# Patient Record
Sex: Male | Born: 1985 | Race: White | Hispanic: No | Marital: Single | Smoking: Never smoker
Health system: Southern US, Community
[De-identification: ages and names within clinical notes are randomized; demographics above are authoritative.]

## PROBLEM LIST (undated history)

## (undated) DIAGNOSIS — R569 Unspecified convulsions: Secondary | ICD-10-CM

## (undated) HISTORY — PX: ANKLE SURGERY: SHX546

## (undated) HISTORY — PX: TONSILLECTOMY: SUR1361

## (undated) HISTORY — PX: FRACTURE SURGERY: SHX138

---

## 2009-10-12 ENCOUNTER — Emergency Department (HOSPITAL_COMMUNITY): Admission: EM | Admit: 2009-10-12 | Discharge: 2009-10-12 | Payer: Self-pay | Admitting: Emergency Medicine

## 2011-04-03 LAB — BASIC METABOLIC PANEL
CO2: 27 mEq/L (ref 19–32)
Chloride: 101 mEq/L (ref 96–112)
GFR calc Af Amer: >60 mL/min (ref >60)
Glucose, Bld: 160 mg/dL — ABNORMAL HIGH (ref 70–99)
Potassium: 4 mEq/L (ref 3.5–5.1)
Sodium: 135 mEq/L (ref 135–145)

## 2011-04-03 LAB — RAPID URINE DRUG SCREEN, HOSP PERFORMED
Amphetamines: POSITIVE — AB
Opiates: NOT DETECTED
Tetrahydrocannabinol: POSITIVE — AB

## 2011-11-01 ENCOUNTER — Emergency Department (HOSPITAL_COMMUNITY): Payer: Self-pay

## 2011-11-01 ENCOUNTER — Emergency Department (HOSPITAL_COMMUNITY)
Admission: EM | Admit: 2011-11-01 | Discharge: 2011-11-01 | Disposition: A | Discharge status: Home / Self Care | Payer: Self-pay | Attending: Emergency Medicine | Admitting: Emergency Medicine

## 2011-11-01 DIAGNOSIS — R609 Edema, unspecified: Secondary | ICD-10-CM | POA: Insufficient documentation

## 2011-11-01 DIAGNOSIS — M25439 Effusion, unspecified wrist: Secondary | ICD-10-CM | POA: Insufficient documentation

## 2011-11-01 DIAGNOSIS — Y9363 Activity, rugby: Secondary | ICD-10-CM | POA: Insufficient documentation

## 2011-11-01 DIAGNOSIS — S59909A Unspecified injury of unspecified elbow, initial encounter: Secondary | ICD-10-CM | POA: Insufficient documentation

## 2011-11-01 DIAGNOSIS — F988 Other specified behavioral and emotional disorders with onset usually occurring in childhood and adolescence: Secondary | ICD-10-CM | POA: Insufficient documentation

## 2011-11-01 DIAGNOSIS — M79609 Pain in unspecified limb: Secondary | ICD-10-CM | POA: Insufficient documentation

## 2011-11-01 DIAGNOSIS — S6990XA Unspecified injury of unspecified wrist, hand and finger(s), initial encounter: Secondary | ICD-10-CM | POA: Insufficient documentation

## 2011-11-01 DIAGNOSIS — M25539 Pain in unspecified wrist: Secondary | ICD-10-CM | POA: Insufficient documentation

## 2011-11-01 DIAGNOSIS — W219XXA Striking against or struck by unspecified sports equipment, initial encounter: Secondary | ICD-10-CM | POA: Insufficient documentation

## 2016-02-20 ENCOUNTER — Encounter (HOSPITAL_COMMUNITY): Payer: Self-pay | Admitting: Cardiology

## 2016-02-20 ENCOUNTER — Emergency Department (HOSPITAL_COMMUNITY)
Admission: EM | Admit: 2016-02-20 | Discharge: 2016-02-20 | Disposition: A | Discharge status: Home / Self Care | Payer: Self-pay | Attending: Emergency Medicine | Admitting: Emergency Medicine

## 2016-02-20 DIAGNOSIS — L03011 Cellulitis of right finger: Secondary | ICD-10-CM | POA: Insufficient documentation

## 2016-02-20 MED ORDER — AMOXICILLIN-POT CLAVULANATE 875-125 MG PO TABS
1.0000 | ORAL_TABLET | Freq: Once | ORAL | Status: AC
  Administered 2016-02-20: 1 via ORAL
  Filled 2016-02-20: qty 1

## 2016-02-20 MED ORDER — HYDROCODONE-ACETAMINOPHEN 5-325 MG PO TABS
1.0000 | ORAL_TABLET | Freq: Once | ORAL | Status: AC
  Administered 2016-02-20: 1 via ORAL
  Filled 2016-02-20: qty 1

## 2016-02-20 MED ORDER — AMOXICILLIN-POT CLAVULANATE 875-125 MG PO TABS: 1.0000 | ORAL_TABLET | Freq: Two times a day (BID) | ORAL | Status: AC

## 2016-02-20 MED ORDER — OXYCODONE-ACETAMINOPHEN 5-325 MG PO TABS
ORAL_TABLET | ORAL | Status: DC
Start: 2016-02-20 — End: 2016-02-20
  Filled 2016-02-20: qty 1

## 2016-02-20 MED ORDER — OXYCODONE-ACETAMINOPHEN 5-325 MG PO TABS
1.0000 | ORAL_TABLET | Freq: Once | ORAL | Status: AC
  Administered 2016-02-20: 1 via ORAL

## 2016-02-20 MED ORDER — HYDROCODONE-ACETAMINOPHEN 5-325 MG PO TABS: 2.0000 | ORAL_TABLET | ORAL | Status: AC | PRN

## 2016-02-20 NOTE — ED Provider Notes (Signed)
CSN: [id number]     Arrival date & time 02/20/16  1155 History   First MD Initiated Contact with Patient 02/20/16 1619     Chief Complaint  Patient presents with  . Finger Injury   Patient is a 30 y.o. male presenting with hand pain. The history is provided by the patient. No language interpreter was used.  Hand Pain This is a new problem. The current episode started today. The problem occurs constantly. The problem has been gradually worsening. Pertinent negatives include no abdominal pain, anorexia, chest pain, chills, congestion, coughing, fever, headaches, joint swelling, nausea, neck pain, numbness, rash, sore throat, vomiting or weakness. Nothing aggravates the symptoms. He has tried nothing for the symptoms. The treatment provided no relief.    History reviewed. No pertinent past medical history. Past Surgical History  Procedure Laterality Date  . Ankle surgery     History reviewed. No pertinent family history. Social History  Substance Use Topics  . Smoking status: Never Smoker   . Smokeless tobacco: None  . Alcohol Use: No    Review of Systems  Constitutional: Negative for fever, chills, activity change and appetite change.  HENT: Negative for congestion, dental problem, ear pain, facial swelling, hearing loss, rhinorrhea, sneezing, sore throat, trouble swallowing and voice change.   Eyes: Negative for photophobia, pain, redness and visual disturbance.  Respiratory: Negative for apnea, cough, chest tightness, shortness of breath, wheezing and stridor.   Cardiovascular: Negative for chest pain, palpitations and leg swelling.  Gastrointestinal: Negative for nausea, vomiting, abdominal pain, diarrhea, constipation, blood in stool, abdominal distention and anorexia.  Endocrine: Negative for polydipsia and polyuria.  Genitourinary: Negative for frequency, hematuria, flank pain, decreased urine volume and difficulty urinating.  Musculoskeletal: Negative for back pain, joint  swelling, gait problem, neck pain and neck stiffness.  Skin: Negative for rash and wound.  Allergic/Immunologic: Negative for immunocompromised state.  Neurological: Negative for dizziness, syncope, facial asymmetry, speech difficulty, weakness, light-headedness, numbness and headaches.  Hematological: Negative for adenopathy.  Psychiatric/Behavioral: Negative for suicidal ideas, behavioral problems, confusion, sleep disturbance and agitation. The patient is not nervous/anxious.   All other systems reviewed and are negative.     Allergies  Review of patient's allergies indicates no known allergies.  Home Medications   Prior to Admission medications   Medication Sig Start Date End Date Taking? Authorizing Provider  amoxicillin-clavulanate (AUGMENTIN) 875-125 MG tablet Take 1 tablet by mouth 2 (two) times daily. 02/20/16 02/27/16  Dan Humphreys, MD  HYDROcodone-acetaminophen (NORCO/VICODIN) 5-325 MG tablet Take 2 tablets by mouth every 4 (four) hours as needed. 02/20/16   Dan Humphreys, MD   BP 161/92 mmHg  Pulse 55  Temp(Src) 98.3 F (36.8 C) (Oral)  Resp 18  SpO2 100% Physical Exam  Constitutional: He is oriented to person, place, and time. He appears well-developed and well-nourished. No distress.  HENT:  Head: Normocephalic and atraumatic.  Right Ear: External ear normal.  Left Ear: External ear normal.  Eyes: Pupils are equal, round, and reactive to light. Right eye exhibits no discharge. Left eye exhibits no discharge.  Neck: Normal range of motion. No JVD present. No tracheal deviation present.  Cardiovascular: Normal rate, regular rhythm and normal heart sounds.  Exam reveals no friction rub.   No murmur heard. Pulmonary/Chest: Effort normal and breath sounds normal. No stridor. No respiratory distress. He has no wheezes.  Abdominal: Soft. Bowel sounds are normal. He exhibits no distension. There is no rebound and no guarding.  Musculoskeletal: Normal range of  motion. He  exhibits no edema or tenderness.       Hands: Lymphadenopathy:    He has no cervical adenopathy.  Neurological: He is alert and oriented to person, place, and time. No cranial nerve deficit. Coordination normal.  Skin: Skin is warm and dry. No rash noted. No pallor.  Psychiatric: He has a normal mood and affect. His behavior is normal. Judgment and thought content normal.  Nursing note and vitals reviewed.   ED Course  Procedures (including critical care time) Labs Review Labs Reviewed - No data to display  Imaging Review No results found. I have personally reviewed and evaluated these images and lab results as part of my medical decision-making.   EKG Interpretation None      MDM   Final diagnoses:  Paronychia, right    Patient with no significant past medical history presents with paronychia to the right thumb. No abscess. No systemic symptoms. Patient does chew on his films include an cuticles.  Patient with cellulitis of base of fingernail but no systemic signs of infection. Patient has full range of motion of thumb.  Patient given [facility] emergency department for pain and given first dose of Augmentin emergency department.  He was discharged with prescription for Augmentin 7 days. He was given Norco for pain control for the next 2-3 days.  Patient are to follow with primary care physician for reevaluation return to ED for any new or worsening symptoms.  Discussed case by attending Dr. Fayrene Fearing.    Dan Humphreys, MD 02/20/16 1714  Rolland Porter, MD 02/29/16 [phone]

## 2016-02-20 NOTE — ED Notes (Signed)
Pt reports right thumb pain with redness and swelling around the thumb nail. States increased pain with palpation. States he picks at his nails often. Also reports pain into his hand.

## 2016-02-20 NOTE — ED Notes (Signed)
Pt instructed he will be unable to drive after pain medication per protocol

## 2016-02-20 NOTE — ED Provider Notes (Signed)
Pt seen and evaluated.  D/W Dr. Moody Bruins.  Exam shows erythema, but no abscess. Agree with abx, soaks, pain meds, and recheck if not improving, or if abscess develops.  Rolland Porter, MD 02/20/16 [phone]

## 2016-02-20 NOTE — Discharge Instructions (Signed)
Paronychia °Paronychia is an infection of the skin that surrounds a nail. It usually affects the skin around a fingernail, but it may also occur near a toenail. It often causes pain and swelling around the nail. This condition may come on suddenly or develop over a longer period. In some cases, a collection of pus (abscess) can form near or under the nail. Usually, paronychia is not serious and it clears up with treatment. °CAUSES °This condition may be caused by bacteria or fungi. It is commonly caused by either Streptococcus or Staphylococcus bacteria. The bacteria or fungi often cause the infection by getting into the affected area through an opening in the skin, such as a cut or a hangnail. °RISK FACTORS °This condition is more likely to develop in: °· People who get their hands wet often, such as those who work as dishwashers, bartenders, or nurses. °· People who bite their fingernails or suck their thumbs. °· People who trim their nails too short. °· People who have hangnails or injured fingertips. °· People who get manicures. °· People who have diabetes. °SYMPTOMS °Symptoms of this condition include: °· Redness and swelling of the skin near the nail. °· Tenderness around the nail when you touch the area. °· Pus-filled bumps under the cuticle. The cuticle is the skin at the base or sides of the nail. °· Fluid or pus under the nail. °· Throbbing pain in the area. °DIAGNOSIS °This condition is usually diagnosed with a physical exam. In some cases, a sample of pus may be taken from an abscess to be tested in a lab. This can help to determine what type of bacteria or fungi is causing the condition. °TREATMENT °Treatment for this condition depends on the cause and severity of the condition. If the condition is mild, it may clear up on its own in a few days. Your health care provider may recommend soaking the affected area in warm water a few times a day. When treatment is needed, the options may  include: °· Antibiotic medicine, if the condition is caused by a bacterial infection. °· Antifungal medicine, if the condition is caused by a fungal infection. °· Incision and drainage, if an abscess is present. In this procedure, the health care provider will cut open the abscess so the pus can drain out. °HOME CARE INSTRUCTIONS °· Soak the affected area in warm water if directed to do so by your health care provider. You may be told to do this for 20 minutes, 2-3 times a day. Keep the area dry in between soakings. °· Take medicines only as directed by your health care provider. °· If you were prescribed an antibiotic medicine, finish all of it even if you start to feel better. °· Keep the affected area clean. °· Do not try to drain a fluid-filled bump yourself. °· If you will be washing dishes or performing other tasks that require your hands to get wet, wear rubber gloves. You should also wear gloves if your hands might come in contact with irritating substances, such as cleaners or chemicals. °· Follow your health care provider's instructions about: °¨ Wound care. °¨ Bandage (dressing) changes and removal. °SEEK MEDICAL CARE IF: °· Your symptoms get worse or do not improve with treatment. °· You have a fever or chills. °· You have redness spreading from the affected area. °· You have continued or increased fluid, blood, or pus coming from the affected area. °· Your finger or knuckle becomes swollen or is difficult to move. °  °  This information is not intended to replace advice given to you by your health care provider. Make sure you discuss any questions you have with your health care provider. °  °Document Released: 06/10/2001 Document Revised: 05/01/2015 Document Reviewed: 11/22/2014 °Elsevier Interactive Patient Education ©2016 [employer] ° °

## 2016-02-20 NOTE — ED Notes (Signed)
Esignature pad not available at this time. Pt agreeable to discharge

## 2016-04-03 ENCOUNTER — Encounter (HOSPITAL_COMMUNITY): Payer: Self-pay | Admitting: *Deleted

## 2016-04-03 ENCOUNTER — Emergency Department (HOSPITAL_COMMUNITY): Payer: Self-pay

## 2016-04-03 ENCOUNTER — Emergency Department (HOSPITAL_COMMUNITY)
Admission: EM | Admit: 2016-04-03 | Discharge: 2016-04-03 | Disposition: A | Discharge status: Home / Self Care | Payer: Self-pay | Attending: Emergency Medicine | Admitting: Emergency Medicine

## 2016-04-03 DIAGNOSIS — Y998 Other external cause status: Secondary | ICD-10-CM | POA: Insufficient documentation

## 2016-04-03 DIAGNOSIS — F1410 Cocaine abuse, uncomplicated: Secondary | ICD-10-CM | POA: Insufficient documentation

## 2016-04-03 DIAGNOSIS — Y9289 Other specified places as the place of occurrence of the external cause: Secondary | ICD-10-CM | POA: Insufficient documentation

## 2016-04-03 DIAGNOSIS — Z72820 Sleep deprivation: Secondary | ICD-10-CM | POA: Insufficient documentation

## 2016-04-03 DIAGNOSIS — S0003XA Contusion of scalp, initial encounter: Secondary | ICD-10-CM | POA: Insufficient documentation

## 2016-04-03 DIAGNOSIS — W228XXA Striking against or struck by other objects, initial encounter: Secondary | ICD-10-CM | POA: Insufficient documentation

## 2016-04-03 DIAGNOSIS — R4182 Altered mental status, unspecified: Secondary | ICD-10-CM | POA: Insufficient documentation

## 2016-04-03 DIAGNOSIS — F1210 Cannabis abuse, uncomplicated: Secondary | ICD-10-CM | POA: Insufficient documentation

## 2016-04-03 DIAGNOSIS — R569 Unspecified convulsions: Secondary | ICD-10-CM | POA: Insufficient documentation

## 2016-04-03 DIAGNOSIS — Y9389 Activity, other specified: Secondary | ICD-10-CM | POA: Insufficient documentation

## 2016-04-03 DIAGNOSIS — F1310 Sedative, hypnotic or anxiolytic abuse, uncomplicated: Secondary | ICD-10-CM | POA: Insufficient documentation

## 2016-04-03 HISTORY — DX: Unspecified convulsions: R56.9

## 2016-04-03 LAB — COMPREHENSIVE METABOLIC PANEL
ALK PHOS: 80 U/L (ref 38–126)
ALT: 23 U/L (ref 17–63)
ANION GAP: 10 (ref 5–15)
AST: 26 U/L (ref 15–41)
Albumin: 4.3 g/dL (ref 3.5–5.0)
BUN: 15 mg/dL (ref 6–20)
CALCIUM: 9.5 mg/dL (ref 8.9–10.3)
CHLORIDE: 104 mmol/L (ref 101–111)
CO2: 24 mmol/L (ref 22–32)
Creatinine, Ser: 1.06 mg/dL (ref 0.61–1.24)
Glucose, Bld: 107 mg/dL — ABNORMAL HIGH (ref 65–99)
Potassium: 4.2 mmol/L (ref 3.5–5.1)
SODIUM: 138 mmol/L (ref 135–145)
Total Bilirubin: 0.8 mg/dL (ref 0.3–1.2)
Total Protein: 7.5 g/dL (ref 6.5–8.1)

## 2016-04-03 LAB — CBC
HCT: 42.7 % (ref 39.0–52.0)
Hemoglobin: 14 g/dL (ref 13.0–17.0)
MCH: 29.4 pg (ref 26.0–34.0)
MCHC: 32.8 g/dL (ref 30.0–36.0)
MCV: 89.7 fL (ref 78.0–100.0)
PLATELETS: 250 10*3/uL (ref 150–400)
RBC: 4.76 MIL/uL (ref 4.22–5.81)
RDW: 12.9 % (ref 11.5–15.5)
WBC: 16 10*3/uL — ABNORMAL HIGH (ref 4.0–10.5)

## 2016-04-03 LAB — RAPID URINE DRUG SCREEN, HOSP PERFORMED
Amphetamines: NOT DETECTED
Barbiturates: NOT DETECTED
Benzodiazepines: POSITIVE — AB
COCAINE: POSITIVE — AB
OPIATES: NOT DETECTED
TETRAHYDROCANNABINOL: POSITIVE — AB

## 2016-04-03 LAB — ETHANOL

## 2016-04-03 MED ORDER — SODIUM CHLORIDE 0.9 % IV BOLUS (SEPSIS)
1000.0000 mL | Freq: Once | INTRAVENOUS | Status: AC
  Administered 2016-04-03: 1000 mL via INTRAVENOUS

## 2016-04-03 NOTE — ED Notes (Signed)
From home via [facility], AMS, hx of seizure, CBG 134, VSS, EKG NS  20g Left hand

## 2016-04-03 NOTE — ED Provider Notes (Signed)
CSN: [id number]     Arrival date & time 04/03/16  1619 History   First MD Initiated Contact with Patient 04/03/16 1639     Chief Complaint  Patient presents with  . Altered Mental Status     (Consider location/radiation/quality/duration/timing/severity/associated sxs/prior Treatment) Patient is a 30 y.o. male presenting with altered mental status. The history is provided by the patient.  Altered Mental Status Presenting symptoms: no confusion   Associated symptoms: seizures   Associated symptoms: no abdominal pain, no fever, no rash, no vomiting and no weakness   Patient w hx 2 prior seizures, presents w generalized seizure todayl asting approx 1 minute.  Pt indicates last seizure was years ago, had been placed on sz med then by pcp, but never refilled, and has been off for years.  States in past few days, relatively heavy etoh use.  Denies daily etoh use/abuse.  Also states slept v little last night. Today was stressed about making appointment, when had seizure. States had eaten/drank very little today. No fever or chills. w seizure, hit head, dull headache to area. No neck or back pain. Denies chest pain. No palpitations. No cough or uri c/o. No abd pain. No nvd. No dysuria or gu c/o. No fever or chills. No oral injury. No incontinence. Was briefly postictal, cbg 134, arrives with baseline mental status.       Past Medical History  Diagnosis Date  . Seizures [facility])    Past Surgical History  Procedure Laterality Date  . Ankle surgery    . Tonsillectomy     No family history on file. Social History  Substance Use Topics  . Smoking status: Never Smoker   . Smokeless tobacco: None  . Alcohol Use: Yes    Review of Systems  Constitutional: Negative for fever and chills.  HENT: Negative for sore throat.   Eyes: Negative for pain and visual disturbance.  Respiratory: Negative for shortness of breath.   Cardiovascular: Negative for chest pain.  Gastrointestinal: Negative for  vomiting, abdominal pain and diarrhea.  Endocrine: Negative for polyuria.  Genitourinary: Negative for dysuria and flank pain.  Musculoskeletal: Negative for back pain and neck pain.  Skin: Negative for rash.  Neurological: Positive for seizures. Negative for weakness and numbness.  Hematological: Does not bruise/bleed easily.  Psychiatric/Behavioral: Negative for confusion.      Allergies  Review of patient's allergies indicates no known allergies.  Home Medications   Prior to Admission medications   Medication Sig Start Date End Date Taking? Authorizing Provider  HYDROcodone-acetaminophen (NORCO/VICODIN) 5-325 MG tablet Take 2 tablets by mouth every 4 (four) hours as needed. 02/20/16   Dan HumphreysMichael Irick, MD   BP 124/79 mmHg  Pulse 63  Temp(Src) 98.2 F (36.8 C) (Oral)  Resp 14  SpO2 96% Physical Exam  Constitutional: He is oriented to person, place, and time. He appears well-developed and well-nourished. No distress.  HENT:  Mouth/Throat: Oropharynx is clear and moist.  Contusion to left superior scalp. Skin intact.   Eyes: Conjunctivae are normal. Pupils are equal, round, and reactive to light.  Neck: Normal range of motion. Neck supple. No tracheal deviation present. No thyromegaly present.  No stiffness or rigidity. No bruit.   Cardiovascular: Normal rate, regular rhythm, normal heart sounds and intact distal pulses.  Exam reveals no gallop and no friction rub.   No murmur heard. Pulmonary/Chest: Effort normal and breath sounds normal. No accessory muscle usage. No respiratory distress. He exhibits no tenderness.  Abdominal: Soft. Bowel sounds  are normal. He exhibits no distension. There is no tenderness.  Genitourinary:  No cva tenderness  Musculoskeletal: Normal range of motion. He exhibits no edema.  CTLS spine, non tender, aligned, no step off. No pain or focal bony tenderness on extremity exam.   Neurological: He is alert and oriented to person, place, and time.   Motor intact bil, stre 5/5. Steady gait.   Skin: Skin is warm and dry. He is not diaphoretic.  Psychiatric: He has a normal mood and affect.  Nursing note and vitals reviewed.   ED Course  Procedures (including critical care time) Labs Review  Results for orders placed or performed during the hospital encounter of 04/03/16  CBC  Result Value Ref Range   WBC 16.0 (H) 4.0 - 10.5 K/uL   RBC 4.76 4.22 - 5.81 MIL/uL   Hemoglobin 14.0 13.0 - 17.0 g/dL   HCT 16.1 09.6 - 04.5 %   MCV 89.7 78.0 - 100.0 fL   MCH 29.4 26.0 - 34.0 pg   MCHC 32.8 30.0 - 36.0 g/dL   RDW 40.9 81.1 - 91.4 %   Platelets 250 150 - 400 K/uL  Comprehensive metabolic panel  Result Value Ref Range   Sodium 138 135 - 145 mmol/L   Potassium 4.2 3.5 - 5.1 mmol/L   Chloride 104 101 - 111 mmol/L   CO2 24 22 - 32 mmol/L   Glucose, Bld 107 (H) 65 - 99 mg/dL   BUN 15 6 - 20 mg/dL   Creatinine, Ser 7.82 0.61 - 1.24 mg/dL   Calcium 9.5 8.9 - 95.6 mg/dL   Total Protein 7.5 6.5 - 8.1 g/dL   Albumin 4.3 3.5 - 5.0 g/dL   AST 26 15 - 41 U/L   ALT 23 17 - 63 U/L   Alkaline Phosphatase 80 38 - 126 U/L   Total Bilirubin 0.8 0.3 - 1.2 mg/dL   GFR calc non Af Amer >60 >60 mL/min   GFR calc Af Amer >60 >60 mL/min   Anion gap 10 5 - 15  Ethanol  Result Value Ref Range   Alcohol, Ethyl (B) <5 <5 mg/dL  Urine rapid drug screen (hosp performed)  Result Value Ref Range   Opiates NONE DETECTED NONE DETECTED   Cocaine POSITIVE (A) NONE DETECTED   Benzodiazepines POSITIVE (A) NONE DETECTED   Amphetamines NONE DETECTED NONE DETECTED   Tetrahydrocannabinol POSITIVE (A) NONE DETECTED   Barbiturates NONE DETECTED NONE DETECTED   Ct Head Wo Contrast  04/03/2016  CLINICAL DATA:  Seizure, contusion, headache EXAM: CT HEAD WITHOUT CONTRAST TECHNIQUE: Contiguous axial images were obtained from the base of the skull through the vertex without intravenous contrast. COMPARISON:  10/12/09 FINDINGS: No mass lesion. No midline shift. No acute  hemorrhage or hematoma. No extra-axial fluid collections. No evidence of acute infarction. No skull fracture. IMPRESSION: Normal head CT Electronically Signed   By: Esperanza Heir M.D.   On: 04/03/2016 18:29       I have personally reviewed and evaluated these images and lab results as part of my medical decision-making.   EKG Interpretation   Date/Time:  Thursday April 03 2016 16:51:47 EDT Ventricular Rate:  65 PR Interval:  142 QRS Duration: 94 QT Interval:  389 QTC Calculation: 404 R Axis:   73 Text Interpretation:  Sinus rhythm ST elev, probable normal early repol  pattern No previous tracing Confirmed by Denton Lank  MD, Caryn Bee ([id number]) on  04/03/2016 5:04:47 PM      MDM  Iv ns. Continuous pulse ox and monitor.   Seizure precautions.  Reviewed nursing notes and prior charts for additional history.   Labs. Ct.  Recheck, tolerating po fluids.  No c/o. No recurrent seizure. No pain. No nv. Afeb.  Pt w no tremor or shakes, denies daily etoh use/abuse.  Pt currently appears stable for d/c. Seizure precautions/no driving or swimming. Refer to close neurology f/u, with possible eeg, decision on meds then.       Cathren Laine, MD 04/03/16 [phone]

## 2016-04-03 NOTE — Discharge Instructions (Signed)
It was our pleasure to provide your ER care today - we hope that you feel better.  Get adequate sleep. Drink plenty of fluids.  Avoid alcohol and/or drug use.   Follow up with neurologist in the coming week - see referral - call office tomorrow morning to arrange appointment.  No driving, swimming, or operating heavy machinery, until cleared to do so by your doctor/neurologist.   Return to ER if worse, new symptoms, seizures, fever, trouble breathing, new or severe pain, other concern.        Seizure, Adult A seizure is abnormal electrical activity in the brain. Seizures usually last from 30 seconds to 2 minutes. There are various types of seizures. Before a seizure, you may have a warning sensation (aura) that a seizure is about to occur. An aura may include the following symptoms:   Fear or anxiety.  Nausea.  Feeling like the room is spinning (vertigo).  Vision changes, such as seeing flashing lights or spots. Common symptoms during a seizure include:  A change in attention or behavior (altered mental status).  Convulsions with rhythmic jerking movements.  Drooling.  Rapid eye movements.  Grunting.  Loss of bladder and bowel control.  Bitter taste in the mouth.  Tongue biting. After a seizure, you may feel confused and sleepy. You may also have an injury resulting from convulsions during the seizure. HOME CARE INSTRUCTIONS   If you are given medicines, take them exactly as prescribed by your health care provider.  Keep all follow-up appointments as directed by your health care provider.  Do not swim or drive or engage in risky activity during which a seizure could cause further injury to you or others until your health care provider says it is OK.  Get adequate rest.  Teach friends and family what to do if you have a seizure. They should:  Lay you on the ground to prevent a fall.  Put a cushion under your head.  Loosen any tight clothing around your  neck.  Turn you on your side. If vomiting occurs, this helps keep your airway clear.  Stay with you until you recover.  Know whether or not you need emergency care. SEEK IMMEDIATE MEDICAL CARE IF:  The seizure lasts longer than 5 minutes.  The seizure is severe or you do not wake up immediately after the seizure.  You have an altered mental status after the seizure.  You are having more frequent or worsening seizures. Someone should drive you to the emergency department or call local emergency services (911 in [place] MAKE SURE YOU:  Understand these instructions.  Will watch your condition.  Will get help right away if you are not doing well or get worse.   This information is not intended to replace advice given to you by your health care provider. Make sure you discuss any questions you have with your health care provider.   Document Released: 12/12/2000 Document Revised: 01/05/2015 Document Reviewed: 07/27/2013 Elsevier Interactive Patient Education 2016 Elsevier Inc.    Stimulant Use Disorder-Cocaine Cocaine is one of a group of powerful drugs called stimulants. Cocaine has medical uses for stopping nosebleeds and for pain control before minor nose or dental surgery. However, cocaine is misused because of the effects that it produces. These effects include:   A feeling of extreme pleasure.  Alertness.  High energy. Common street names for cocaine include coke, crack, blow, snow, and nose candy. Cocaine is snorted, dissolved in water and injected, or smoked.  Stimulants  are addictive because they activate regions of the brain that produce both the pleasurable sensation of "reward" and psychological dependence. Together, these actions account for loss of control and the rapid development of drug dependence. This means you become ill without the drug (withdrawal) and need to keep using it to function.  Stimulant use disorder is use of stimulants that disrupts your daily  life. It disrupts relationships with family and friends and how you do your job. Cocaine increases your blood pressure and heart rate. It can cause a heart attack or stroke. Cocaine can also cause death from irregular heart rate or seizures. SYMPTOMS Symptoms of stimulant use disorder with cocaine include:  Use of cocaine in larger amounts or over a longer period of time than intended.  Unsuccessful attempts to cut down or control cocaine use.  A lot of time spent obtaining, using, or recovering from the effects of cocaine.  A strong desire or urge to use cocaine (craving).  Continued use of cocaine in spite of major problems at work, school, or home because of use.  Continued use of cocaine in spite of relationship problems because of use.  Giving up or cutting down on important life activities because of cocaine use.  Use of cocaine over and over in situations when it is physically hazardous, such as driving a car.  Continued use of cocaine in spite of a physical problem that is likely related to use. Physical problems can include:  Malnutrition.  Nosebleeds.  Chest pain.  High blood pressure.  A hole that develops between the part of your nose that separates your nostrils (perforated nasal septum).  Lung and kidney damage.  Continued use of cocaine in spite of a mental problem that is likely related to use. Mental problems can include:  Schizophrenia-like symptoms.  Depression.  Bipolar mood swings.  Anxiety.  Sleep problems.  Need to use more and more cocaine to get the same effect, or lessened effect over time with use of the same amount of cocaine (tolerance).  Having withdrawal symptoms when cocaine use is stopped, or using cocaine to reduce or avoid withdrawal symptoms. Withdrawal symptoms include:  Depressed or irritable mood.  Low energy or restlessness.  Bad dreams.  Poor or excessive sleep.  Increased appetite. DIAGNOSIS Stimulant use disorder  is diagnosed by your health care provider. You may be asked questions about your cocaine use and how it affects your life. A physical exam may be done. A drug screen may be ordered. You may be referred to a mental health professional. The diagnosis of stimulant use disorder requires at least two symptoms within 12 months. The type of stimulant use disorder depends on the number of signs and symptoms you have. The type may be:  Mild. Two or three signs and symptoms.  Moderate. Four or five signs and symptoms.  Severe. Six or more signs and symptoms. TREATMENT Treatment for stimulant use disorder is usually provided by mental health professionals with training in substance use disorders. The following options are available:  Counseling or talk therapy. Talk therapy addresses the reasons you use cocaine and ways to keep you from using again. Goals of talk therapy include:  Identifying and avoiding triggers for use.  Handling cravings.  Replacing use with healthy activities.  Support groups. Support groups provide emotional support, advice, and guidance.  Medicine. Certain medicines may decrease cocaine cravings or withdrawal symptoms. HOME CARE INSTRUCTIONS  Take medicines only as directed by your health care provider.  Identify the  people and activities that trigger your cocaine use and avoid them.  Keep all follow-up visits as directed by your health care provider. SEEK MEDICAL CARE IF:  Your symptoms get worse or you relapse.  You are not able to take medicines as directed. SEEK IMMEDIATE MEDICAL CARE IF:  You have serious thoughts about hurting yourself or others.  You have a seizure, chest pain, sudden weakness, or loss of speech or vision. FOR MORE INFORMATION  National Institute on Drug Abuse: http://www.price-smith.com/  Substance Abuse and Mental Health Services Administration: SkateOasis.com.pt   This information is not intended to replace advice given to you by your health care  provider. Make sure you discuss any questions you have with your health care provider.   Document Released: 12/12/2000 Document Revised: 01/05/2015 Document Reviewed: 12/28/2013 Elsevier Interactive Patient Education 2016 [employer].    [employer] Guide Outpatient Counseling/Substance Abuse Adult The United Ways 211 is a great source of information about community services available.  Access by dialing 2-1-1 from anywhere in [place], or by website -  PooledIncome.pl.   Other Local Resources (Updated 12/2015)  Crisis Hotlines   Services     Area Served  [employer]  Crisis Hotline, available 24 hours a day, 7 days a week: 415-794-5486 [facility], [place]   [facility]  Crisis Hotline, available 24 hours a day, 7 days a week: (602) 501-7872 [facility], [place]  [facility]  Suicide Prevention Hotline, available 24 hours a day, 7 days a week: (432)841-6871 [facility] [place], [place]  [employer], available 24 hours a day, 7 days a week: (250) 235-6290 [facility], [place]   [employer] Access to [employer], available 24 hours a day, 7 days a week: (515) 827-0886 All   [facility]  Crisis Hotline, available 24 hours a day, 7 days a week: (754) 631-1377 All   Other Local Resources (Updated 12/2015)  Outpatient Counseling/ Substance Abuse Programs  Services     Address and Phone Number  ADS (Alcohol and Drug Services)   Options include Individual counseling, group counseling, intensive outpatient program (several hours a day, several days a week)  Offers depression assessments  Provides methadone maintenance program [phone] [address] [place], [place] [address]   [facility]   Offers partial hospitalization/day treatment and DUI/DWI programs  [employer], private insurance [phone] [address] [place], [place] [address]    [facility]    Services include intensive outpatient program (several hours a day, several days a week), outpatient treatment, DUI/DWI services, family education  Also has some services specifically for [employer] transitional housing  [phone] [address] [place], [place] [address]     [facility]  [employer], private pay, and private insurance [phone] [address], [address] [place], [place] [address]  [employer] of Care  Services include individual counseling, substance abuse intensive outpatient program (several hours a day, several days a week), day treatment  Delene Loll, Medicaid, private insurance [phone] [address] [place], [place] [address]  [facility]   Offers substance abuse intensive outpatient program (several hours a day, several days a week), partial hospitalization program [phone] [address] [place], [place] [address]  [phone] [address] [place], [place] [address]  [phone] [address] [place], [place] [address]  [phone], [address] [place], [place] [address]  [facility]  Individual counseling only  [employer]s private insurance only [phone] [address], [address] [place], [place] [address]  [facility]  Methadone maintenance program [phone] [address] [place], [place] [address]  [facility]  Walk-In Clinic providing substance abuse and mental health counseling  Accepts Medicaid, Medicare, private insurance  Offers sliding scale for uninsured [phone] [address] [place], [place]   [facility] [employer]  Offers individual counseling, and intensive in-home services [phone] [address], [address] [place], [place] [address]  [facility]  Offers individual counseling, family counseling,  group therapy, domestic violence counseling, consumer credit counseling  Accepts Medicare, Medicaid, private insurance  Offers sliding scale for uninsured [phone] [address] [place], [place] [address]  [phone] [facility], [address] [place], [place] [address]  [facility]  Offers individual, family and group counseling  3 locations - [place], [place], and [place]  [phone]  [address] [place], [place] [address]  [address] [place], [place] [address]  [address] [place], [place] [address]  [facility]    Offers psychiatric assessment, 8-week Intensive Outpatient Program (several hours a day, several times a week, daytime or evenings), early recovery group, family Program, medication management  Private pay or private insurance only [phone], or  [phone] [address] [place], [place] [address]  [facility]  Offers individual, couples and family counseling  Accepts Medicaid, private insurance, and sliding scale for uninsured [phone] [address] [place], [place] [address]  Len Blalockavid Fuller, MD  Individual counseling  Private insurance [phone] [address] [place], [place] [address]  [facility]   Offers assessment, substance abuse treatment, and behavioral health treatment [phone] [address] [place], [place] [address]  [facility]  Individual counseling  Accepts private insurance [phone] [address] [place], [place] [address]  [facility]  Individual counseling  Delene Lollccepts Medicare, private insurance [phone] [address] [place], [place] [address]  [facility]    Offers intensive outpatient program (several hours a day, several times a week)  Private pay, private insurance [phone] [facility] [address] [facility] [place], [place]  [facility]  Individual  counseling  Medicare, private insurance [phone] [address], [address] [place], [place] [address]  [facility]    Offers intensive outpatient program (several hours a day, several times a week) and partial hospitalization program [phone] [address] [place], [place] [address]  Emerson MonteParrish McKinney, MD  Individual counseling [phone] [address], [address] [place], [place] [address]  [facility]  Offers Christian counseling to individuals, couples, and families  Accepts Medicare and private insurance; offers sliding scale for uninsured [phone] [address] [place], [place] [address]  [facility]  [facility] [phone] [address], [address] [place], [place] [address]  [facility]   Offers crisis counseling, individual counseling, group therapy, in-home therapy, domestic violence services, day treatment, DWI services, Administrator, artsCommunity Support Team (CST), Assertive Community Treatment Team (ACTT), substance abuse Intensive Outpatient Program (several hours a day, several times a week)  2 locations - [place] and [place] [phone] [address] [place], [place] [address]  [phone] [address] [place], [place] [address]  [facility]     Individual counseling and group therapy  Accepts private insurance, [place]are, [place]icaid [phone] [address] [place], [place]  [facility]  Offers individual and family counseling  Nutritional therapist private insurance and private pay [phone] [address] [place], [place] [address]  [facility]    Offers individual counseling, group therapy, and outpatient detox  Accepts private insurance [phone] [address] [place], [place]  [facility]  Individual counseling  Accepts Medicare, private insurance  [phone] [address] [place], [place] [address]  [employer]  Individual counseling  Accepts Medicare, private insurance [phone] [address] [place], [place] [address]  [facility]   Offers substance abuse Intensive Outpatient Program (several hours a day, several times a week) [phone], or [phone] [place], [place]

## 2016-04-03 NOTE — ED Notes (Signed)
Pt states states he smoked pot on Monday and drank pack of "high gravity beer" on Tues and Wed and he drank none today.  Denies hx of DT's.

## 2017-09-01 ENCOUNTER — Emergency Department (HOSPITAL_COMMUNITY)
Admission: EM | Admit: 2017-09-01 | Discharge: 2017-09-01 | Disposition: A | Discharge status: Home / Self Care | Payer: Self-pay | Attending: Emergency Medicine | Admitting: Emergency Medicine

## 2017-09-01 ENCOUNTER — Encounter (HOSPITAL_COMMUNITY): Payer: Self-pay | Admitting: *Deleted

## 2017-09-01 DIAGNOSIS — L237 Allergic contact dermatitis due to plants, except food: Secondary | ICD-10-CM | POA: Insufficient documentation

## 2017-09-01 MED ORDER — NAPROXEN 500 MG PO TABS: 500.0000 mg | ORAL_TABLET | Freq: Two times a day (BID) | ORAL | 0 refills | Status: AC

## 2017-09-01 MED ORDER — TRIAMCINOLONE ACETONIDE 0.1 % EX CREA: 1.0000 "application " | TOPICAL_CREAM | Freq: Two times a day (BID) | CUTANEOUS | 0 refills | Status: AC

## 2017-09-01 MED ORDER — PREDNISONE 20 MG PO TABS: ORAL_TABLET | ORAL | 0 refills | Status: AC

## 2017-09-01 MED ORDER — PREDNISONE 20 MG PO TABS
60.0000 mg | ORAL_TABLET | Freq: Once | ORAL | Status: AC
  Administered 2017-09-01: 60 mg via ORAL
  Filled 2017-09-01: qty 3

## 2017-09-01 MED ORDER — NAPROXEN 250 MG PO TABS
500.0000 mg | ORAL_TABLET | Freq: Once | ORAL | Status: AC
Start: 2017-09-01 — End: 2017-09-01
  Administered 2017-09-01: 500 mg via ORAL
  Filled 2017-09-01: qty 2

## 2017-09-01 NOTE — ED Notes (Signed)
Patient Alert and oriented X4. Stable and ambulatory. Patient verbalized understanding of the discharge instructions.  Patient belongings were taken by the patient.  

## 2017-09-01 NOTE — ED Triage Notes (Addendum)
Pt c/o poison ivy x 3 days to bilateral arms and hands; blister noted to L index finger

## 2017-09-01 NOTE — Discharge Instructions (Signed)
Take the prescribed medication as directed.  Can use calamine lotion to help dry out affected areas as well.  Try not to pop blisters, let them pop on their own.  Monitor for any signs of infection-- increased redness, drainage, fever, etc. Recommend Benadryl at nighttime since this makes you drowsy. Follow-up with your primary care doctor. Return to the ED for new or worsening symptoms.

## 2017-09-01 NOTE — ED Provider Notes (Signed)
[facility] DEPT Provider Note   CSN: [id number] Arrival date & time: 09/01/17  0118     History   Chief Complaint Chief Complaint  Patient presents with  . Poison Ivy    HPI Tanner Hobbs is a 31 y.o. male.  The history is provided by the patient and medical records.     31 year old male with history of seizures, presenting to the ED with rash. 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 States since then he has had rash popping up in several different areas, mostly bilateral hands, forearms, abdomen, and neck. States he has lost all of his clothes and shoes that he was wearing that it has taken multiple showers. States he tried cleansing the area with some over-the-counter cream and Windex to see her that would write out the poison ivy. He has not tried any Benadryl or steroid cream.  He denies any chest pain or shortness of breath. No difficulty swallowing or sensation of throat closing.  Past Medical History:  Diagnosis Date  . Seizures (HCC)     There are no active problems to display for this patient.   Past Surgical History:  Procedure Laterality Date  . ANKLE SURGERY    . FRACTURE SURGERY    . TONSILLECTOMY         Home Medications    Prior to Admission medications   Medication Sig Start Date End Date Taking? Authorizing Provider  HYDROcodone-acetaminophen (NORCO/VICODIN) 5-325 MG tablet Take 2 tablets by mouth every 4 (four) hours as needed. 02/20/16   Dan Humphreys, MD    Family History No family history on file.  Social History Social History  Substance Use Topics  . Smoking status: Never Smoker  . Smokeless tobacco: Never Used  . Alcohol use Yes     Allergies   Patient has no known allergies.   Review of Systems Review of Systems   Physical Exam Updated Vital Signs BP (!) 156/94 (BP Location: Right Arm)   Pulse 86   Temp 98.1 F (36.7 C) (Oral)   Resp 20   Ht 5\' 7"  (1.702 m)   Wt 72.6  kg (160 lb)   SpO2 100%   BMI 25.06 kg/m   Physical Exam  Constitutional: He is oriented to person, place, and time. He appears well-developed and well-nourished.  HENT:  Head: Normocephalic and atraumatic.  Mouth/Throat: Oropharynx is clear and moist.  No oral swelling or airway compromise  Eyes: Pupils are equal, round, and reactive to light. Conjunctivae and EOM are normal.  Neck: Normal range of motion.  Cardiovascular: Normal rate, regular rhythm and normal heart sounds.   Pulmonary/Chest: Effort normal and breath sounds normal.  Abdominal: Soft. Bowel sounds are normal.  Musculoskeletal: Normal range of motion.  Neurological: He is alert and oriented to person, place, and time.  Skin: Skin is warm and dry.  Vesicular rash and linear changes noted to dorsal hands, forearms, abdomen, and posterior neck, there is a larger blister noted to medial aspect of right index finger, this is intact without any active drainage, no signs of superimposed infection or cellulitis, no lesions on the palms  Psychiatric: He has a normal mood and affect.  Nursing note and vitals reviewed.    ED Treatments / Results  Labs (all labs ordered are listed, but only abnormal results are displayed) Labs Reviewed - No data to display  EKG  EKG Interpretation None  Radiology No results found.  Procedures Procedures (including critical care time)  Medications Ordered in ED Medications - No data to display   Initial Impression / Assessment and Plan / ED Course  I have reviewed the triage vital signs and the nursing notes.  Pertinent labs & imaging results that were available during my care of the patient were reviewed by me and considered in my medical decision making (see chart for details).  31 year old male here with rash. This is consistent with poison ivy. Works for [employer] and reports recent exposure. There are no signs of superimposed infection or cellulitis. No  airway compromise. VSS.  Does have rather diffuse rash across bilateral dorsal hands, forearms, abdomen, and posterior neck.  Will treat with oral and topical steroids, benadryl.  Can follow-up with PCP.  Discussed plan with patient, he acknowledged understanding and agreed with plan of care.  Return precautions given for new or worsening symptoms.  Final Clinical Impressions(s) / ED Diagnoses   Final diagnoses:  Poison ivy    New Prescriptions New Prescriptions   NAPROXEN (NAPROSYN) 500 MG TABLET    Take 1 tablet (500 mg total) by mouth 2 (two) times daily with a meal.   PREDNISONE (DELTASONE) 20 MG TABLET    Take 40 mg by mouth daily for 3 days, then 20mg  by mouth daily for 3 days, then 10mg  daily for 3 days   TRIAMCINOLONE CREAM (KENALOG) 0.1 %    Apply 1 application topically 2 (two) times daily.     Garlon HatchetSanders, Zamira Hickam M, PA-C 09/01/17 0236    Shon BatonHorton, Courtney F, MD 09/01/17 [phone]

## 2018-03-09 ENCOUNTER — Emergency Department (HOSPITAL_COMMUNITY): Payer: Self-pay

## 2018-03-09 ENCOUNTER — Emergency Department (HOSPITAL_COMMUNITY)
Admission: EM | Admit: 2018-03-09 | Discharge: 2018-03-09 | Disposition: A | Discharge status: Home / Self Care | Payer: Self-pay | Attending: Physician Assistant | Admitting: Physician Assistant

## 2018-03-09 ENCOUNTER — Encounter (HOSPITAL_COMMUNITY): Payer: Self-pay

## 2018-03-09 DIAGNOSIS — Z79899 Other long term (current) drug therapy: Secondary | ICD-10-CM | POA: Insufficient documentation

## 2018-03-09 DIAGNOSIS — R569 Unspecified convulsions: Secondary | ICD-10-CM | POA: Insufficient documentation

## 2018-03-09 LAB — COMPREHENSIVE METABOLIC PANEL
ALT: 26 U/L (ref 17–63)
ANION GAP: 12 (ref 5–15)
AST: 40 U/L (ref 15–41)
Albumin: 4.5 g/dL (ref 3.5–5.0)
Alkaline Phosphatase: 86 U/L (ref 38–126)
BILIRUBIN TOTAL: 1 mg/dL (ref 0.3–1.2)
BUN: 13 mg/dL (ref 6–20)
CHLORIDE: 105 mmol/L (ref 101–111)
CO2: 21 mmol/L — ABNORMAL LOW (ref 22–32)
Calcium: 9.3 mg/dL (ref 8.9–10.3)
Creatinine, Ser: 1.13 mg/dL (ref 0.61–1.24)
GFR calc Af Amer: >60 mL/min (ref >60)
GFR calc non Af Amer: >60 mL/min (ref >60)
Glucose, Bld: 88 mg/dL (ref 65–99)
POTASSIUM: 4 mmol/L (ref 3.5–5.1)
SODIUM: 138 mmol/L (ref 135–145)
TOTAL PROTEIN: 7.8 g/dL (ref 6.5–8.1)

## 2018-03-09 LAB — CBC WITH DIFFERENTIAL/PLATELET
BASOS ABS: 0 10*3/uL (ref 0.0–0.1)
Basophils Relative: 1 %
Eosinophils Absolute: 0.1 10*3/uL (ref 0.0–0.7)
Eosinophils Relative: 1 %
HEMATOCRIT: 43.9 % (ref 39.0–52.0)
Hemoglobin: 14.6 g/dL (ref 13.0–17.0)
LYMPHS ABS: 1.8 10*3/uL (ref 0.7–4.0)
LYMPHS PCT: 20 %
MCH: 30.6 pg (ref 26.0–34.0)
MCHC: 33.3 g/dL (ref 30.0–36.0)
MCV: 92 fL (ref 78.0–100.0)
MONO ABS: 0.8 10*3/uL (ref 0.1–1.0)
Monocytes Relative: 9 %
NEUTROS ABS: 6.2 10*3/uL (ref 1.7–7.7)
Neutrophils Relative %: 69 %
Platelets: 240 10*3/uL (ref 150–400)
RBC: 4.77 MIL/uL (ref 4.22–5.81)
RDW: 13.3 % (ref 11.5–15.5)
WBC: 8.8 10*3/uL (ref 4.0–10.5)

## 2018-03-09 LAB — I-STAT TROPONIN, ED: Troponin i, poc: 0 ng/mL (ref 0.00–0.08)

## 2018-03-09 LAB — I-STAT CG4 LACTIC ACID, ED: Lactic Acid, Venous: 3.02 mmol/L (ref 0.5–1.9)

## 2018-03-09 NOTE — ED Notes (Signed)
I-STAT LACTIC ACID RESULTS REPORTED TO Redmond BasemanHayden, RN and Corlis LeakMackuen, MD

## 2018-03-09 NOTE — ED Provider Notes (Signed)
[facility]   CSN: [id number] Arrival date & time: 03/09/18  1140     History   Chief Complaint Chief Complaint  Patient presents with  . Fall    HPI Tanner Hobbs is a 32 y.o. male.  HPI   Patient is a 32 year old male who had unwitnessed fall from roof while installing solar panels.  Coworkers were unsure whether he got electrocuted because there is been some open electrical areas on the roof.  Patient also has history of seizures.  Patient was confused on EMS arrival, postictal.  But is now alert and oriented x3 but with no memory of falling.  Past Medical History:  Diagnosis Date  . Seizures (HCC)     There are no active problems to display for this patient.   Past Surgical History:  Procedure Laterality Date  . ANKLE SURGERY    . FRACTURE SURGERY    . TONSILLECTOMY         Home Medications    Prior to Admission medications   Medication Sig Start Date End Date Taking? Authorizing Provider  HYDROcodone-acetaminophen (NORCO/VICODIN) 5-325 MG tablet Take 2 tablets by mouth every 4 (four) hours as needed. 02/20/16   Dan HumphreysIrick, Michael, MD  naproxen (NAPROSYN) 500 MG tablet Take 1 tablet (500 mg total) by mouth 2 (two) times daily with a meal. 09/01/17   Garlon HatchetSanders, Lisa M, PA-C  predniSONE (DELTASONE) 20 MG tablet Take 40 mg by mouth daily for 3 days, then 20mg  by mouth daily for 3 days, then 10mg  daily for 3 days 09/01/17   Garlon HatchetSanders, Lisa M, PA-C  triamcinolone cream (KENALOG) 0.1 % Apply 1 application topically 2 (two) times daily. 09/01/17   Garlon HatchetSanders, Lisa M, PA-C    Family History No family history on file.  Social History Social History   Tobacco Use  . Smoking status: Never Smoker  . Smokeless tobacco: Never Used  Substance Use Topics  . Alcohol use: Yes  . Drug use: Yes    Types: Marijuana     Allergies   Patient has no known allergies.   Review of Systems Review of Systems  Constitutional: Negative  for activity change.  Respiratory: Negative for shortness of breath.   Cardiovascular: Negative for chest pain.  Gastrointestinal: Negative for abdominal pain.  Neurological: Positive for headaches.  All other systems reviewed and are negative.    Physical Exam Updated Vital Signs Temp 98.5 F (36.9 C) (Oral)   SpO2 94%   Physical Exam  Constitutional: He is oriented to person, place, and time. He appears well-nourished.  HENT:  Head: Normocephalic.  Eyes: Conjunctivae are normal. Right eye exhibits no discharge. Left eye exhibits no discharge.  Cardiovascular: Normal rate and regular rhythm.  No murmur heard. Pulmonary/Chest: Effort normal and breath sounds normal. No respiratory distress.  Neurological: He is oriented to person, place, and time.  Alert and oriented x3 however amnestic to the event.  Skin: Skin is warm and dry. He is not diaphoretic.  Small area of erythema to the right head.  Otherwise no trauma.  Psychiatric: He has a normal mood and affect. His behavior is normal.     ED Treatments / Results  Labs (all labs ordered are listed, but only abnormal results are displayed) Labs Reviewed  COMPREHENSIVE METABOLIC PANEL  CBC WITH DIFFERENTIAL/PLATELET  I-STAT TROPONIN, ED    EKG  EKG Interpretation None       Radiology No results found.  Procedures Procedures (including  critical care time)  Medications Ordered in ED Medications - No data to display   Initial Impression / Assessment and Plan / ED Course  I have reviewed the triage vital signs and the nursing notes.  Pertinent labs & imaging results that were available during my care of the patient were reviewed by me and considered in my medical decision making (see chart for details).     Patient is a 32 year old male who had unwitnessed fall from roof while installing solar panels.  Coworkers were unsure whether he got electrocuted because there is been some open electrical areas on the  roof.  Patient also has history of seizures.  Patient was confused on EMS arrival, postictal.  But is now alert and oriented x3 but with no memory of falling.  12:04 PM Given not remembering following off, and patient's history of seizure, as well as confusion after the fall,, I am concerned that this could have been a seizure.  Also possible that is a concussion.  We will get baseline labs, get lactic, try to detect whether it was a seizure.  Will get EKG and troponin given the questionable history of electrocution. If all is reassuring, patient's physical exam is normal, and vital signs continue to remain many normal, patient will be discharged with instructions not to drive until follow-up with neurology.   Final Clinical Impressions(s) / ED Diagnoses   Final diagnoses:  None    ED Discharge Orders    None       Rosaleigh Brazzel, Cindee Salt, MD 03/09/18 1557

## 2018-03-09 NOTE — Discharge Instructions (Signed)
Please not drive until he follow-up with neurology.  Please do not perform any actions where it could be dangerous if you lose consciousness such as bathing alone, bathing a small child driving or operating heavy machinery or working on a roof.

## 2018-03-09 NOTE — ED Notes (Signed)
Patient ambulated without difficulty to restroom.

## 2018-03-09 NOTE — ED Triage Notes (Signed)
Pt presents via [facility] after fall today. Pt was installing solar panels on rooftop when a coworker heard him fall. Initially EMS was called for possible electrocution. Pt had LOC, [facility] 14.

## 2019-02-07 IMAGING — CT CT CERVICAL SPINE W/O CM
3 of 4 series · 12 of 33 positions shown, 14 images · non-contrast
Comparison: 04/03/2016

CLINICAL DATA: Fell from roof.  Possible electrical shock injury

EXAM:
CT HEAD WITHOUT CONTRAST
CT CERVICAL SPINE WITHOUT CONTRAST
TECHNIQUE: Multidetector CT imaging of the head and cervical spine was
performed following the standard protocol without intravenous
contrast. Multiplanar CT image reconstructions of the cervical spine
were also generated.

[Series 6: sag bone · sagittal · 0.24mm/px · 5 of 65 slices shown, 6 images]
[im 22/65  bone]
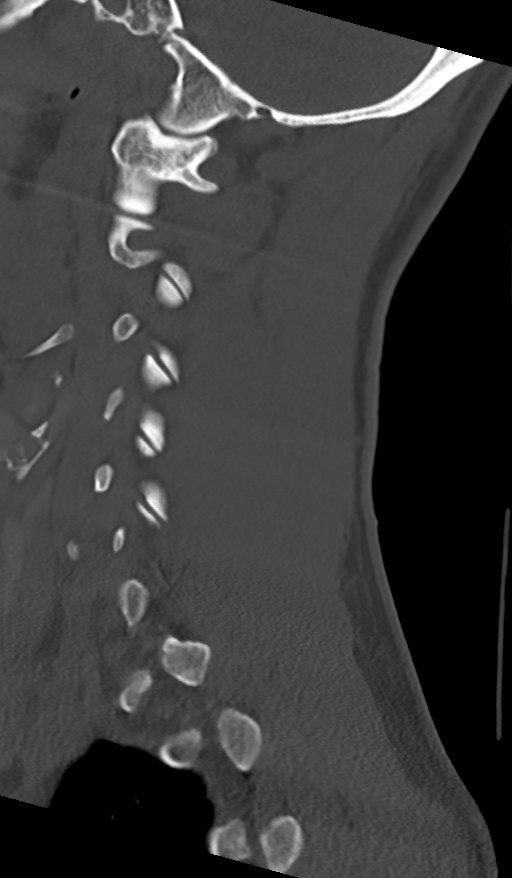
[im 27/65  bone]
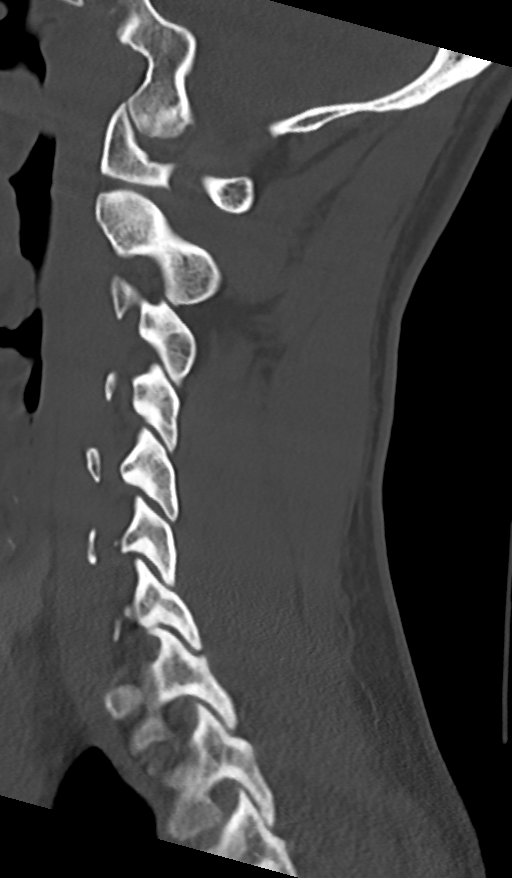
[im 33/65  soft-tissue]
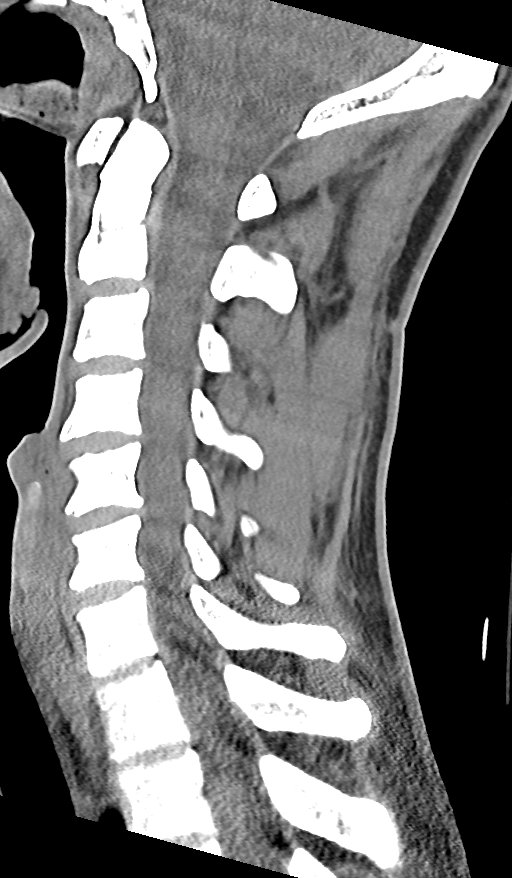
[im 33/65  bone]
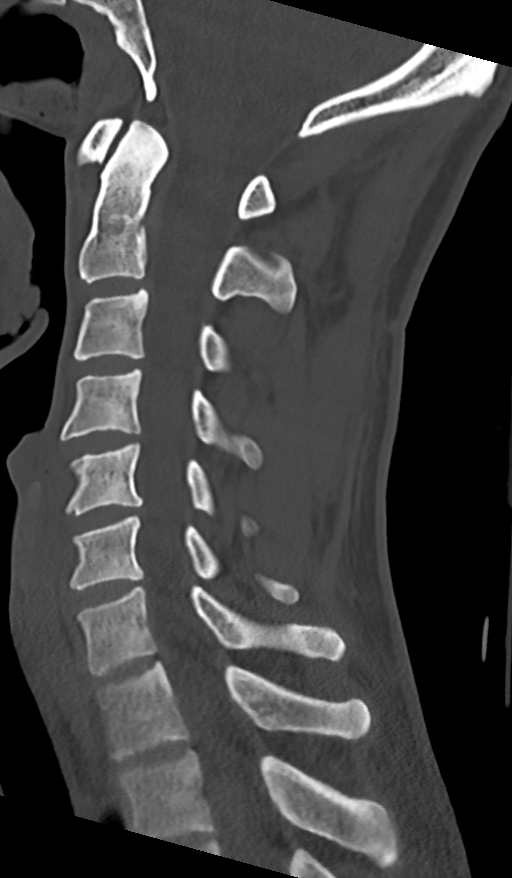
[im 38/65  bone]
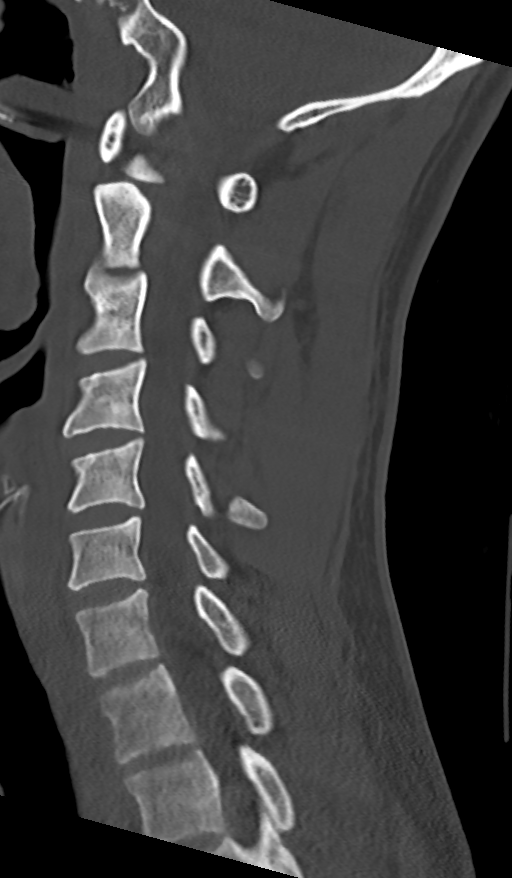
[im 43/65  bone]
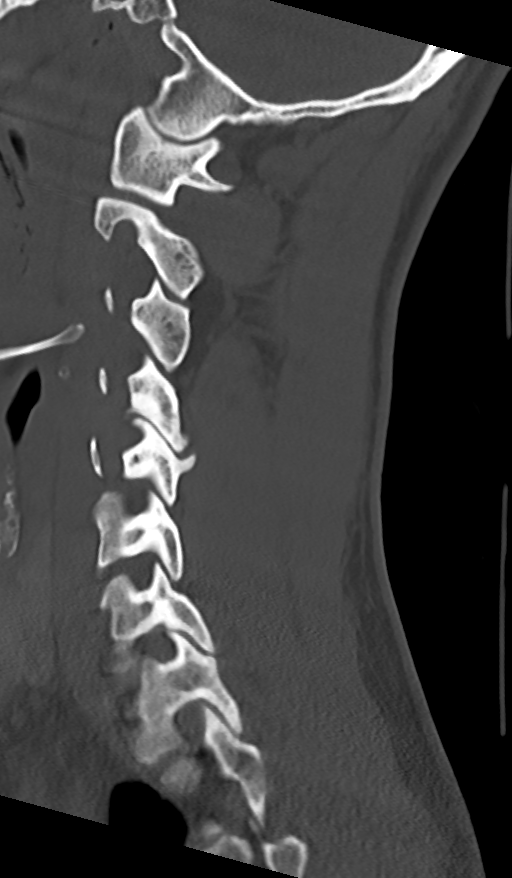

[Series 7: cor bone · coronal · 0.29mm/px · 3 of 61 slices shown]
[im 13/61  bone]
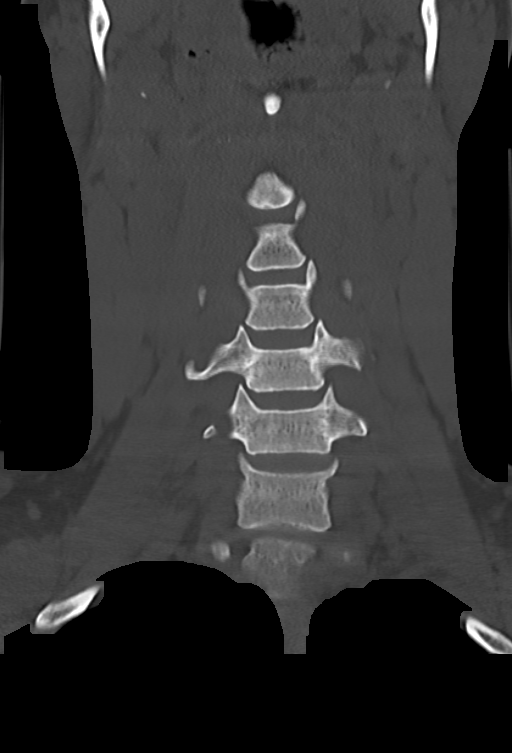
[im 25/61  bone]
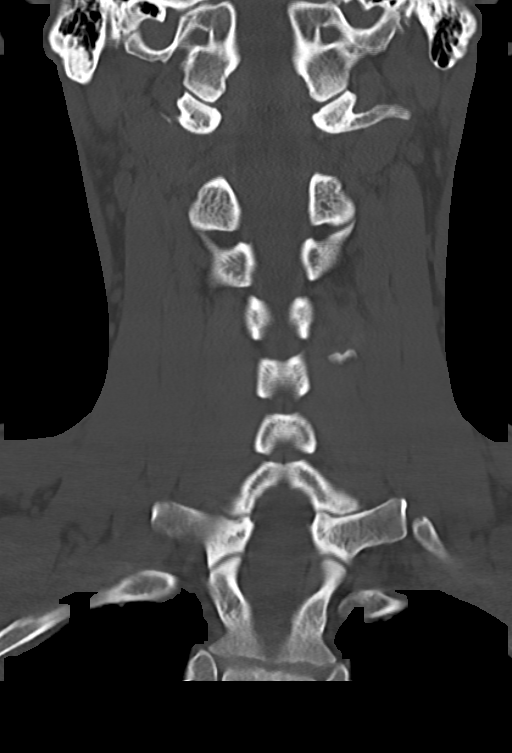
[im 37/61  bone]
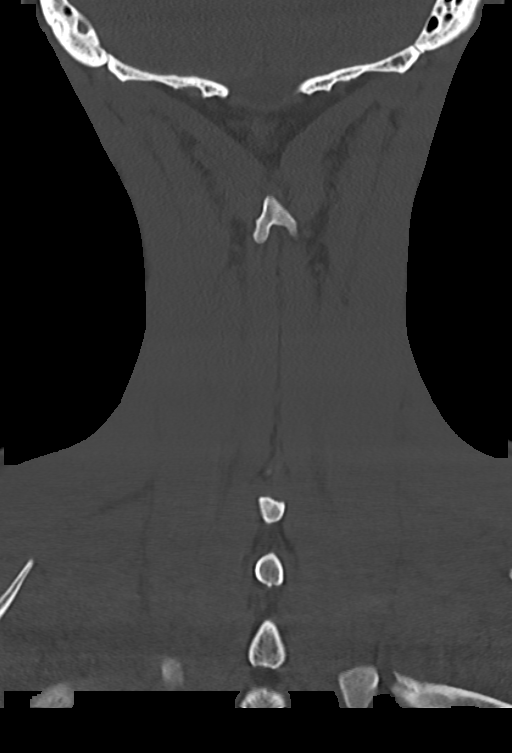

[Series 8: orthogonal axials · axial · 0.24mm/px · z∈[-241,-138]mm · 4 of 99 slices shown, 5 images]
[im 17/99  soft-tissue]
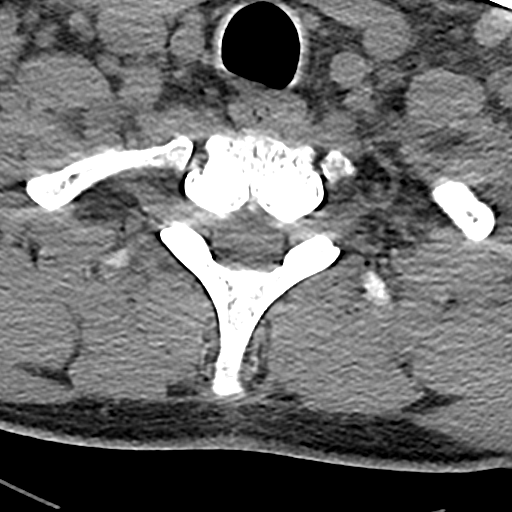
[im 17/99  bone]
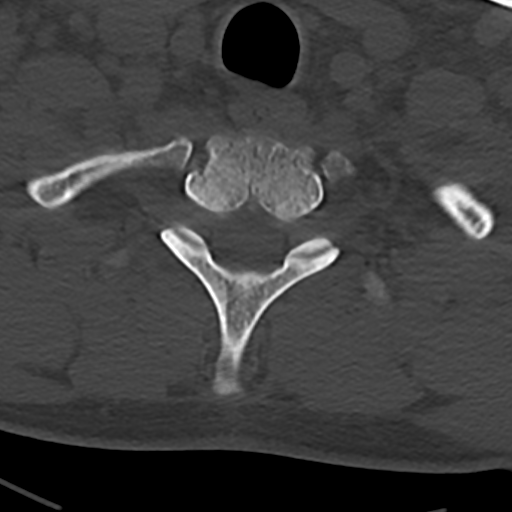
[im 33/99  bone]
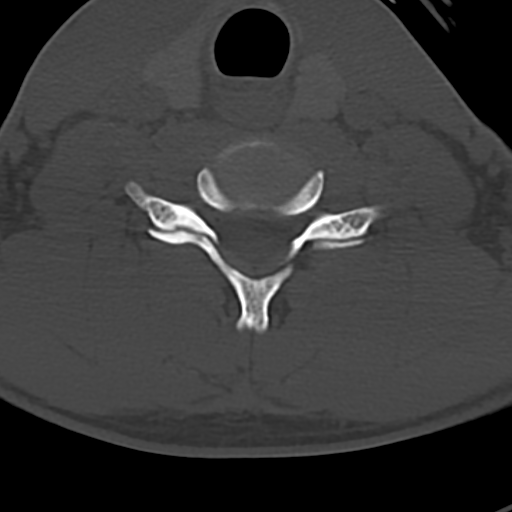
[im 66/99  bone]
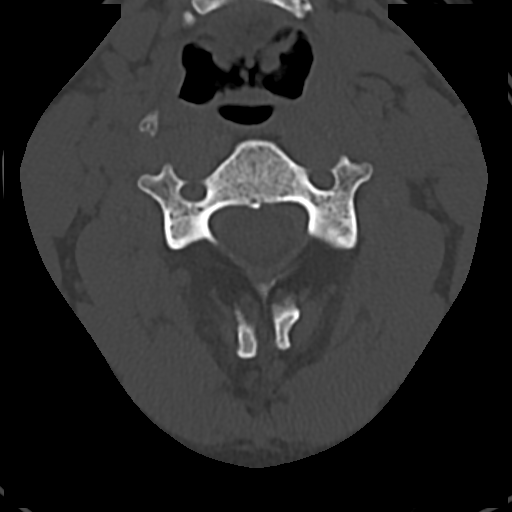
[im 82/99  bone]
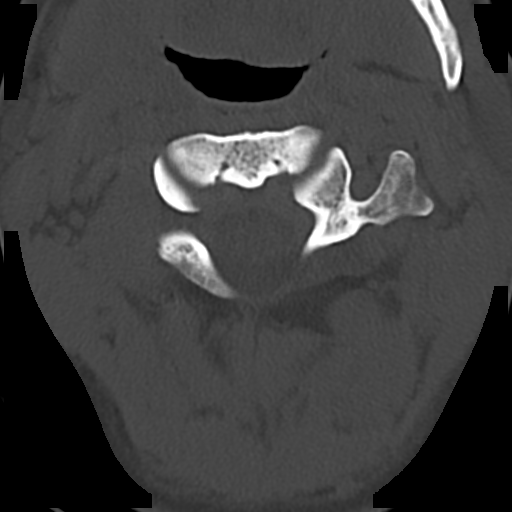

[12 of 33 positions shown; findings below may reference images not displayed]

FINDINGS: CT HEAD FINDINGS

Brain: The brain shows a normal appearance without evidence of
malformation, atrophy, old or acute small or large vessel
infarction, mass lesion, hemorrhage, hydrocephalus or extra-axial
collection.

Vascular: No hyperdense vessel. No evidence of atherosclerotic
calcification.

Skull: Normal.  No traumatic finding.  No focal bone lesion.

Sinuses/Orbits: Sinuses are clear. Orbits appear normal. Mastoids
are clear.

Other: None significant

CT CERVICAL SPINE FINDINGS

Alignment: Normal

Skull base and vertebrae: Normal.  No fracture or focal lesion.

Soft tissues and spinal canal: Normal

Disc levels: No degenerative changes. Wide patency of the canal and
foramina.

Upper chest: Normal

Other: None
IMPRESSION: Normal head CT.

Normal cervical spine CT.
